# Patient Record
Sex: Male | Born: 1950 | Race: White | Hispanic: No | Marital: Married | Smoking: Never smoker
Health system: Southern US, Community
[De-identification: ages and names within clinical notes are randomized; demographics above are authoritative.]

## PROBLEM LIST (undated history)

## (undated) DIAGNOSIS — H409 Unspecified glaucoma: Secondary | ICD-10-CM

## (undated) DIAGNOSIS — I82409 Acute embolism and thrombosis of unspecified deep veins of unspecified lower extremity: Secondary | ICD-10-CM

## (undated) HISTORY — PX: TONSILLECTOMY: SUR1361

## (undated) HISTORY — PX: PILONIDAL CYST EXCISION: SHX744

## (undated) HISTORY — PX: ADENOIDECTOMY: SUR15

---

## 1999-09-15 ENCOUNTER — Encounter: Payer: Self-pay | Admitting: Family Medicine

## 1999-09-15 ENCOUNTER — Encounter: Admission: RE | Admit: 1999-09-15 | Discharge: 1999-09-15 | Payer: Self-pay | Admitting: Family Medicine

## 2014-06-29 ENCOUNTER — Encounter (HOSPITAL_BASED_OUTPATIENT_CLINIC_OR_DEPARTMENT_OTHER): Payer: Self-pay | Admitting: *Deleted

## 2014-06-29 ENCOUNTER — Emergency Department (HOSPITAL_BASED_OUTPATIENT_CLINIC_OR_DEPARTMENT_OTHER): Payer: BLUE CROSS/BLUE SHIELD

## 2014-06-29 ENCOUNTER — Emergency Department (HOSPITAL_BASED_OUTPATIENT_CLINIC_OR_DEPARTMENT_OTHER)
Admission: EM | Admit: 2014-06-29 | Discharge: 2014-06-29 | Disposition: A | Discharge status: Home / Self Care | Payer: BLUE CROSS/BLUE SHIELD | Attending: Emergency Medicine | Admitting: Emergency Medicine

## 2014-06-29 ENCOUNTER — Telehealth (HOSPITAL_BASED_OUTPATIENT_CLINIC_OR_DEPARTMENT_OTHER): Payer: Self-pay | Admitting: *Deleted

## 2014-06-29 ENCOUNTER — Ambulatory Visit (HOSPITAL_BASED_OUTPATIENT_CLINIC_OR_DEPARTMENT_OTHER)
Admission: EM | Admit: 2014-06-29 | Discharge: 2014-06-29 | Disposition: A | Discharge status: Home / Self Care | Payer: BLUE CROSS/BLUE SHIELD | Source: Home / Self Care | Attending: Emergency Medicine | Admitting: Emergency Medicine

## 2014-06-29 DIAGNOSIS — Z880 Allergy status to penicillin: Secondary | ICD-10-CM | POA: Diagnosis not present

## 2014-06-29 DIAGNOSIS — M7989 Other specified soft tissue disorders: Secondary | ICD-10-CM

## 2014-06-29 DIAGNOSIS — Z79899 Other long term (current) drug therapy: Secondary | ICD-10-CM | POA: Insufficient documentation

## 2014-06-29 DIAGNOSIS — H409 Unspecified glaucoma: Secondary | ICD-10-CM | POA: Diagnosis not present

## 2014-06-29 DIAGNOSIS — E860 Dehydration: Secondary | ICD-10-CM | POA: Insufficient documentation

## 2014-06-29 DIAGNOSIS — I9589 Other hypotension: Secondary | ICD-10-CM | POA: Insufficient documentation

## 2014-06-29 DIAGNOSIS — I959 Hypotension, unspecified: Secondary | ICD-10-CM

## 2014-06-29 DIAGNOSIS — R42 Dizziness and giddiness: Secondary | ICD-10-CM | POA: Diagnosis not present

## 2014-06-29 DIAGNOSIS — I82402 Acute embolism and thrombosis of unspecified deep veins of left lower extremity: Secondary | ICD-10-CM | POA: Diagnosis not present

## 2014-06-29 DIAGNOSIS — R2242 Localized swelling, mass and lump, left lower limb: Secondary | ICD-10-CM | POA: Diagnosis present

## 2014-06-29 HISTORY — DX: Unspecified glaucoma: H40.9

## 2014-06-29 LAB — BASIC METABOLIC PANEL
Anion gap: 5 (ref 5–15)
BUN: 25 mg/dL — ABNORMAL HIGH (ref 6–23)
CO2: 24 mmol/L (ref 19–32)
Calcium: 9.8 mg/dL (ref 8.4–10.5)
Chloride: 108 mEq/L (ref 96–112)
Creatinine, Ser: 1.02 mg/dL (ref 0.50–1.35)
GFR calc Af Amer: 88 mL/min — ABNORMAL LOW (ref >90)
GFR calc non Af Amer: 76 mL/min — ABNORMAL LOW (ref >90)
Glucose, Bld: 146 mg/dL — ABNORMAL HIGH (ref 70–99)
Potassium: 3.9 mmol/L (ref 3.5–5.1)
Sodium: 137 mmol/L (ref 135–145)

## 2014-06-29 LAB — CBC WITH DIFFERENTIAL/PLATELET
Basophils Absolute: 0.1 10*3/uL (ref 0.0–0.1)
Basophils Relative: 1 % (ref 0–1)
Eosinophils Absolute: 0.4 10*3/uL (ref 0.0–0.7)
Eosinophils Relative: 4 % (ref 0–5)
HCT: 40.2 % (ref 39.0–52.0)
Hemoglobin: 13.6 g/dL (ref 13.0–17.0)
Lymphocytes Relative: 36 % (ref 12–46)
Lymphs Abs: 4.3 10*3/uL — ABNORMAL HIGH (ref 0.7–4.0)
MCH: 31 pg (ref 26.0–34.0)
MCHC: 33.8 g/dL (ref 30.0–36.0)
MCV: 91.6 fL (ref 78.0–100.0)
Monocytes Absolute: 1.4 10*3/uL — ABNORMAL HIGH (ref 0.1–1.0)
Monocytes Relative: 11 % (ref 3–12)
Neutro Abs: 5.7 10*3/uL (ref 1.7–7.7)
Neutrophils Relative %: 48 % (ref 43–77)
Platelets: 228 10*3/uL (ref 150–400)
RBC: 4.39 MIL/uL (ref 4.22–5.81)
RDW: 12.6 % (ref 11.5–15.5)
WBC: 11.8 10*3/uL — ABNORMAL HIGH (ref 4.0–10.5)

## 2014-06-29 LAB — TROPONIN I: Troponin I: <0.03 ng/mL (ref <0.031)

## 2014-06-29 LAB — D-DIMER, QUANTITATIVE (NOT AT ARMC): D DIMER QUANT: 8.11 ug{FEU}/mL — AB (ref 0.00–0.48)

## 2014-06-29 MED ORDER — RIVAROXABAN 15 MG PO TABS
15.0000 mg | ORAL_TABLET | Freq: Once | ORAL | Status: AC
  Administered 2014-06-29: 15 mg via ORAL
  Filled 2014-06-29: qty 1

## 2014-06-29 MED ORDER — XARELTO VTE STARTER PACK 15 & 20 MG PO TBPK: 15.0000 mg | ORAL_TABLET | ORAL | Status: AC

## 2014-06-29 MED ORDER — IOHEXOL 350 MG/ML SOLN
100.0000 mL | Freq: Once | INTRAVENOUS | Status: AC | PRN
  Administered 2014-06-29: 100 mL via INTRAVENOUS

## 2014-06-29 MED ORDER — HEPARIN (PORCINE) IN NACL 100-0.45 UNIT/ML-% IJ SOLN
1200.0000 [IU]/h | INTRAMUSCULAR | Status: DC
  Administered 2014-06-29: 1200 [IU]/h via INTRAVENOUS
  Filled 2014-06-29: qty 250

## 2014-06-29 MED ORDER — HEPARIN BOLUS VIA INFUSION
5000.0000 [IU] | Freq: Once | INTRAVENOUS | Status: AC
  Administered 2014-06-29: 5000 [IU] via INTRAVENOUS

## 2014-06-29 MED ORDER — SODIUM CHLORIDE 0.9 % IV BOLUS (SEPSIS)
1000.0000 mL | Freq: Once | INTRAVENOUS | Status: AC
  Administered 2014-06-29: 1000 mL via INTRAVENOUS

## 2014-06-29 NOTE — ED Notes (Signed)
Leg discomfort onset , noticed leg swelling at ~ 0200.

## 2014-06-29 NOTE — ED Notes (Addendum)
Pt alert, NAD, calm, interactive, resps e/u, speaking in clear complete sentences. Here with wife. C/o acute onset L leg swelling (thigh to foot) and leg pain. [place] (denies: CP, sob, dizziness or nausea), back to room 4, [facility]

## 2014-06-29 NOTE — ED Notes (Signed)
I placed patient on monitor/5 lead.

## 2014-06-29 NOTE — ED Notes (Signed)
Patient returned for venous doppler and results to Dr. Rosalia Hammersay. I spoke with patient and spouse and informed them of results and importance of following up with primary MD immediately. Patient did not receive xarelto starter pack last pm so I issued one today and [facility] No shortness of breath, denies leg pain.

## 2014-06-29 NOTE — Discharge Instructions (Signed)
Take blood thinners as discussed. Schedule appointment with primary doctor for reassessment and further workup of blood clot and plan. Return to the ER if you pass out, chest pain, shortness of breath, cold leg or bleeding.  If you were given medicines take as directed.  If you are on coumadin or contraceptives realize their levels and effectiveness is altered by many different medicines.  If you have any reaction (rash, tongues swelling, other) to the medicines stop taking and see a physician.   Please follow up as directed and return to the ER or see a physician for new or worsening symptoms.  Thank you. Filed Vitals:   06/29/14 0315 06/29/14 0330 06/29/14 0400 06/29/14 0415  BP: 124/74 124/74    Pulse: 87 85 88 92  Temp:      TempSrc:      Resp: Height:      Weight:      SpO2: 99% 95% 96% 96%    Deep Vein Thrombosis A deep vein thrombosis (DVT) is a blood clot that develops in the deep, larger veins of the leg, arm, or pelvis. These are more dangerous than clots that might form in veins near the surface of the body. A DVT can lead to serious and even life-threatening complications if the clot breaks off and travels in the bloodstream to the lungs.  A DVT can damage the valves in your leg veins so that instead of flowing upward, the blood pools in the lower leg. This is called post-thrombotic syndrome, and it can result in pain, swelling, discoloration, and sores on the leg. CAUSES Usually, several things contribute to the formation of blood clots. Contributing factors include:  The flow of blood slows down.  The inside of the vein is damaged in some way.  You have a condition that makes blood clot more easily. RISK FACTORS Some people are more likely than others to develop blood clots. Risk factors include:   Smoking.  Being overweight (obese).  Sitting or lying still for a long time. This includes long-distance travel, paralysis, or recovery from an illness or  surgery. Other factors that increase risk are:   Older age, especially over 34 years of age.  Having a family history of blood clots or if you have already had a blot clot.  Having major or lengthy surgery. This is especially true for surgery on the hip, knee, or belly (abdomen). Hip surgery is particularly high risk.  Having a long, thin tube (catheter) placed inside a vein during a medical procedure.  Breaking a hip or leg.  Having cancer or cancer treatment.  Pregnancy and childbirth.  Hormone changes make the blood clot more easily during pregnancy.  The fetus puts pressure on the veins of the pelvis.  There is a risk of injury to veins during delivery or a caesarean delivery. The risk is highest just after childbirth.  Medicines containing the male hormone estrogen. This includes birth control pills and hormone replacement therapy.  Other circulation or heart problems.  SIGNS AND SYMPTOMS When a clot forms, it can either partially or totally block the blood flow in that vein. Symptoms of a DVT can include:  Swelling of the leg or arm, especially if one side is much worse.  Warmth and redness of the leg or arm, especially if one side is much worse.  Pain in an arm or leg. If the clot is in the leg, symptoms may be more noticeable or worse when  standing or walking. The symptoms of a DVT that has traveled to the lungs (pulmonary embolism, PE) usually start suddenly and include:  Shortness of breath.  Coughing.  Coughing up blood or blood-tinged mucus.  Chest pain. The chest pain is often worse with deep breaths.  Rapid heartbeat. Anyone with these symptoms should get emergency medical treatment right away. Do not wait to see if the symptoms will go away. Call your local emergency services (911 in the [place] if you have these symptoms. Do not drive yourself to the hospital. DIAGNOSIS If a DVT is suspected, your health care provider will take a full medical history  and perform a physical exam. Tests that also may be required include:  Blood tests, including studies of the clotting properties of the blood.  Ultrasound to see if you have clots in your legs or lungs.  X-rays to show the flow of blood when dye is injected into the veins (venogram).  Studies of your lungs if you have any chest symptoms. PREVENTION  Exercise the legs regularly. Take a brisk 30-minute walk every day.  Maintain a weight that is appropriate for your height.  Avoid sitting or lying in bed for long periods of time without moving your legs.  Women, particularly those over the age of 35 years, should consider the risks and benefits of taking estrogen medicines, including birth control pills.  Do not smoke, especially if you take estrogen medicines.  Long-distance travel can increase your risk of DVT. You should exercise your legs by walking or pumping the muscles every hour.  Many of the risk factors above relate to situations that exist with hospitalization, either for illness, injury, or elective surgery. Prevention may include medical and nonmedical measures.  Your health care provider will assess you for the need for venous thromboembolism prevention when you are admitted to the hospital. If you are having surgery, your surgeon will assess you the day of or day after surgery. TREATMENT Once identified, a DVT can be treated. It can also be prevented in some circumstances. Once you have had a DVT, you may be at increased risk for a DVT in the future. The most common treatment for DVT is blood-thinning (anticoagulant) medicine, which reduces the blood's tendency to clot. Anticoagulants can stop new blood clots from forming and stop old clots from growing. They cannot dissolve existing clots. Your body does this by itself over time. Anticoagulants can be given by mouth, through an IV tube, or by injection. Your health care provider will determine the best program for you. Other  medicines or treatments that may be used are:  Heparin or related medicines (low molecular weight heparin) are often the first treatment for a blood clot. They act quickly. However, they cannot be taken orally and must be given either in shot form or by IV tube.  Heparin can cause a fall in a component of blood that stops bleeding and forms blood clots (platelets). You will be monitored with blood tests to be sure this does not occur.  Warfarin is an anticoagulant that can be swallowed. It takes a few days to start working, so usually heparin or related medicines are used in combination. Once warfarin is working, heparin is usually stopped.  Factor Xa inhibitor medicines, such as rivaroxaban and apixaban, also reduce blood clotting. These medicines are taken orally and can often be used without heparin or related medicines.  Less commonly, clot dissolving drugs (thrombolytics) are used to dissolve a DVT. They carry a  high risk of bleeding, so they are used mainly in severe cases where your life or a part of your body is threatened.  Very rarely, a blood clot in the leg needs to be removed surgically.  If you are unable to take anticoagulants, your health care provider may arrange for you to have a filter placed in a main vein in your abdomen. This filter prevents clots from traveling to your lungs. HOME CARE INSTRUCTIONS  Take all medicines as directed by your health care provider.  Learn as much as you can about DVT.  Wear a medical alert bracelet or carry a medical alert card.  Ask your health care provider how soon you can go back to normal activities. It is important to stay active to prevent blood clots. If you are on anticoagulant medicine, avoid contact sports.  It is very important to exercise. This is especially important while traveling, sitting, or standing for long periods of time. Exercise your legs by walking or by tightening and relaxing your leg muscles regularly. Take  frequent walks.  You may need to wear compression stockings. These are tight elastic stockings that apply pressure to the lower legs. This pressure can help keep the blood in the legs from clotting. Taking Warfarin Warfarin is a daily medicine that is taken by mouth. Your health care provider will advise you on the length of treatment (usually 3-6 months, sometimes lifelong). If you take warfarin:  Understand how to take warfarin and foods that can affect how warfarin works in Public relations account executive.  Too much and too little warfarin are both dangerous. Too much warfarin increases the risk of bleeding. Too little warfarin continues to allow the risk for blood clots. Warfarin and Regular Blood Testing While taking warfarin, you will need to have regular blood tests to measure your blood clotting time. These blood tests usually include both the prothrombin time (PT) and international normalized ratio (INR) tests. The PT and INR results allow your health care provider to adjust your dose of warfarin. It is very important that you have your PT and INR tested as often as directed by your health care provider.  Warfarin and Your Diet Avoid major changes in your diet, or notify your health care provider before changing your diet. Arrange a visit with a registered dietitian to answer your questions. Many foods, especially foods high in vitamin K, can interfere with warfarin and affect the PT and INR results. You should eat a consistent amount of foods high in vitamin K. Foods high in vitamin K include:   Spinach, kale, broccoli, cabbage, collard and turnip greens, Brussels sprouts, peas, cauliflower, seaweed, and parsley.  Beef and pork liver.  Green tea.  Soybean oil. Warfarin with Other Medicines Many medicines can interfere with warfarin and affect the PT and INR results. You must:  Tell your health care provider about any and all medicines, vitamins, and supplements you take, including aspirin and other  over-the-counter anti-inflammatory medicines. Be especially cautious with aspirin and anti-inflammatory medicines. Ask your health care provider before taking these.  Do not take or discontinue any prescribed or over-the-counter medicine except on the advice of your health care provider or pharmacist. Warfarin Side Effects Warfarin can have side effects, such as easy bruising and difficulty stopping bleeding. Ask your health care provider or pharmacist about other side effects of warfarin. You will need to:  Hold pressure over cuts for longer than usual.  Notify your dentist and other health care providers that you are  taking warfarin before you undergo any procedures where bleeding may occur. Warfarin with Alcohol and Tobacco   Drinking alcohol frequently can increase the effect of warfarin, leading to excess bleeding. It is best to avoid alcoholic drinks or to consume only very small amounts while taking warfarin. Notify your health care provider if you change your alcohol intake.   Do not use any tobacco products including cigarettes, chewing tobacco, or electronic cigarettes. If you smoke, quit. Ask your health care provider for help with quitting smoking. Alternative Medicines to Warfarin: Factor Xa Inhibitor Medicines  These blood-thinning medicines are taken by mouth, usually for several weeks or longer. It is important to take the medicine every single day at the same time each day.  There are no regular blood tests required when using these medicines.  There are fewer food and drug interactions than with warfarin.  The side effects of this class of medicine are similar to those of warfarin, including excessive bruising or bleeding. Ask your health care provider or pharmacist about other potential side effects. SEEK MEDICAL CARE IF:  You notice a rapid heartbeat.  You feel weaker or more tired than usual.  You feel faint.  You notice increased bruising.  You feel your  symptoms are not getting better in the time expected.  You believe you are having side effects of medicine. SEEK IMMEDIATE MEDICAL CARE IF:  You have chest pain.  You have trouble breathing.  You have new or increased swelling or pain in one leg.  You cough up blood.  You notice blood in vomit, in a bowel movement, or in urine. MAKE SURE YOU:  Understand these instructions.  Will watch your condition.  Will get help right away if you are not doing well or get worse. Document Released: 06/06/2005 Document Revised: 10/21/2013 Document Reviewed: 02/11/2013 [facility] Patient Information 2015 [place], [place]. This information is not intended to replace advice given to you by your health care provider. Make sure you discuss any questions you have with your health care provider.

## 2014-06-29 NOTE — ED Provider Notes (Signed)
Patient seen last night and clinically diagnosed with dvt and started on xarelto.  Received call from radiology with confirmatory doppler of lle.  Plan to discuss with patient and explain diagnosis as well as confirm medication and follow up.   Hilario Quarryanielle S Hiba Garry, MD 06/29/14 [phone]

## 2014-06-29 NOTE — ED Notes (Signed)
Patient stated he is having left upper leg pain and swelling with groin pain also. Patient stated no other pain, no chest,neck, or jaw pain. Pain BP was 85/35 with large cuff. I changed to smaller cuff and got BP of 83/58. I notified nurse Kaila.

## 2014-06-29 NOTE — ED Provider Notes (Signed)
CSN: [id number]     Arrival date & time 06/29/14  0209 History   First MD Initiated Contact with Patient 06/29/14 0232     Chief Complaint  Patient presents with  . Leg Swelling     (Consider location/radiation/quality/duration/timing/severity/associated sxs/prior Treatment) HPI Comments: 64 year old male with no significant medical history, nonsmoker presents with significant left leg swelling this evening. Patient recently returned from a short flight 2 days prior.Patient denies blood clot history, active cancer, recent major trauma or surgery, recent long travel, hemoptysis or oral contraceptives. No change in color in the leg. No fevers chills or redness. Patient denies shortness breath or chest pain, mild lightheadedness.   The history is provided by the patient.    Past Medical History  Diagnosis Date  . Glaucoma    Past Surgical History  Procedure Laterality Date  . Tonsillectomy    . Adenoidectomy    . Pilonidal cyst excision     No family history on file. History  Substance Use Topics  . Smoking status: Never Smoker   . Smokeless tobacco: Not on file  . Alcohol Use: No    Review of Systems  Constitutional: Negative for fever and chills.  HENT: Negative for congestion.   Eyes: Negative for visual disturbance.  Respiratory: Negative for shortness of breath.   Cardiovascular: Positive for leg swelling. Negative for chest pain.  Gastrointestinal: Negative for vomiting and abdominal pain.  Genitourinary: Negative for dysuria and flank pain.  Musculoskeletal: Negative for back pain, neck pain and neck stiffness.  Skin: Negative for rash.  Neurological: Positive for light-headedness. Negative for syncope and headaches.      Allergies  Penicillins  Home Medications   Prior to Admission medications   Medication Sig Start Date End Date Taking? Authorizing Provider  timolol (BETIMOL) 0.25 % ophthalmic solution 1-2 drops 2 (two) times daily.   Yes Historical  Provider, MD  Travoprost, BAK Free, (TRAVATAN) 0.004 % SOLN ophthalmic solution 1 drop at bedtime.   Yes Historical Provider, MD  XARELTO STARTER PACK 15 & 20 MG TBPK Take 15 mg by mouth as directed. Take as directed on package: Start with one 15mg  tablet by mouth twice a day with food. On Day 22, switch to one 20mg  tablet once a day with food. 06/29/14   Enid SkeensJoshua M Siris Hoos, MD   BP 124/74 mmHg  Pulse 92  Temp(Src) 97.3 F (36.3 C) (Oral)  Resp 13  Ht 5\' 10"  (1.778 m)  Wt 215 lb (97.523 kg)  BMI 30.85 kg/m2  SpO2 96% Physical Exam  Constitutional: He is oriented to person, place, and time. He appears well-developed and well-nourished.  HENT:  Head: Normocephalic and atraumatic.  Eyes: Conjunctivae are normal. Right eye exhibits no discharge. Left eye exhibits no discharge.  Neck: Normal range of motion. Neck supple. No tracheal deviation present.  Cardiovascular: Normal rate and regular rhythm.   Pulmonary/Chest: Effort normal and breath sounds normal.  Abdominal: Soft. He exhibits no distension. There is no tenderness. There is no guarding.  Musculoskeletal: He exhibits edema and tenderness.  Patient has moderate swelling to the thigh and lower extremity and the left, no significant discoloration, normal Refill distal leg, 2+ pulses distal left leg. No warmth or sign of infection.  Neurological: He is alert and oriented to person, place, and time.  Skin: Skin is warm. No rash noted.  Psychiatric: He has a normal mood and affect.  Nursing note and vitals reviewed.   ED Course  Procedures (including critical care  time)   EMERGENCY DEPARTMENT [place] CARDIAC EXAM "Study: Limited Ultrasound of the heart and pericardium"  INDICATIONS:Hypotension Multiple views of the heart and pericardium were obtained in real-time with a multi-frequency probe.  PERFORMED ZO:XWRUEA  IMAGES ARCHIVED?: Yes  FINDINGS: No pericardial effusion, Normal contractility and Tamponade physiology absent   VIEWS  USED: Subcostal 4 chamber, Parasternal long axis, Parasternal short axis and Inferior Vena Cava  INTERPRETATION: Cardiac activity present, Pericardial effusioin absent, Cardiac tamponade absent, Probable low CVP and Normal contractility no enlarge right heart  Emergency Ultrasound Study:  Limited Duplex of left lower extremity veins  Performed by Dr. Jodi Mourning Indication: leg pain and/or swelling Visualization of saphenous-femoral junction, proximal femoral vein and popliteal vein regions in transverse plane with full compression visualized.  Interpretation left prox fem vein dvt visualized.  Images archived electronically.  '  Labs Review Labs Reviewed  BASIC METABOLIC PANEL - Abnormal; Notable for the following:    Glucose, Bld 146 (*)    BUN 25 (*)    GFR calc non Af Amer 76 (*)    GFR calc Af Amer 88 (*)    All other components within normal limits  CBC WITH DIFFERENTIAL - Abnormal; Notable for the following:    WBC 11.8 (*)    Lymphs Abs 4.3 (*)    Monocytes Absolute 1.4 (*)    All other components within normal limits  D-DIMER, QUANTITATIVE - Abnormal; Notable for the following:    D-Dimer, Quant 8.11 (*)    All other components within normal limits  TROPONIN I    Imaging Review Ct Angio Chest Pe W/cm &/or Wo Cm  06/29/2014   CLINICAL DATA:  Lower extremity swelling and pain. Recent air travel on Friday.  EXAM: CT ANGIOGRAPHY CHEST WITH CONTRAST  TECHNIQUE: Multidetector CT imaging of the chest was performed using the standard protocol during bolus administration of intravenous contrast. Multiplanar CT image reconstructions and MIPs were obtained to evaluate the vascular anatomy.  CONTRAST:  OMNIPAQUE IOHEXOL 350 MG/ML SOLN  COMPARISON:  None.  FINDINGS: Technically adequate study with good opacification of the central and segmental pulmonary arteries. No focal filling defects. No evidence of significant pulmonary embolus.  Normal heart size. Normal caliber thoracic aorta.  No evidence of aortic dissection. Great vessel origins are patent. 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 The esophagus is decompressed. No significant lymphadenopathy in the chest. All  Lungs are clear. No focal airspace disease or consolidation. Airways appear patent. No pleural effusion. No pneumothorax.  Included portions of the upper abdominal organs are grossly unremarkable. Mild degenerative changes in the thoracic spine with normal alignment. No destructive bone lesions appreciated.  Review of the MIP images confirms the above findings.  IMPRESSION: No evidence of significant pulmonary embolus. No evidence of active pulmonary disease. 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   Electronically Signed   By: Burman Nieves M.D.   On: 06/29/2014 04:08   Dg Chest Portable 1 View  06/29/2014   CLINICAL DATA:  Acute onset LEFT leg swelling. Recent flight on Friday.  EXAM: PORTABLE CHEST - 1 VIEW  COMPARISON:  None.  FINDINGS: Cardiomediastinal silhouette is unremarkable. Mild pulmonary hyper expansion. The lungs are clear without pleural effusions or focal consolidations. Trachea projects midline and there is no pneumothorax. Soft tissue planes and included osseous structures are non-suspicious.  IMPRESSION: No acute cardiopulmonary process.   Electronically Signed   By: Awilda Metro   On: 06/29/2014 03:42     EKG  Interpretation None     EKG reviewed heart rate 78, sinus, normal QT, no acute ST changes MDM   Final diagnoses:  Hypotension  Leg DVT (deep venous thromboembolism), acute, left  Transient hypotension  Dehydration  Left leg swelling   Patient presents with left leg swelling, lightheadedness and in triage hypotensive. Clinical concern for DVT in the left leg and possible pulmonary embolism causing hypotension. Fortunately patient has no chest pain shortness of breath or syncope. Bedside ultrasound performed no right heart strain, significant blood clot  seen left proximal femoral vein, unable to obtain formal at this time for extent of clot. Patient remained asymptomatic except for left leg swelling in ER, IV fluids given and blood pressure quickly normalized. Heparin started prophylactically, CT scan performed in no pulmonary embolism seen.  Discussed with pharmacy, recommended transition to Xarelto without delay. Long discussion with patient regarding pathophysiology of blood clot, importance of close follow-up with primary Dr. for possible early cancer diagnosis or other cause of blood clot. Discussed oral anticoagulation, strict reasons to return in follow-up for this. Xarelto first dose given in the ER, heparin stopped.  Results and differential diagnosis were discussed with the patient/parent/guardian. Close follow up outpatient was discussed, comfortable with the plan.   Medications  Rivaroxaban (XARELTO) tablet 15 mg (not administered)  sodium chloride 0.9 % bolus 1,000 mL (1,000 mLs Intravenous New Bag/Given 06/29/14 0310)  heparin bolus via infusion 5,000 Units (0 Units Intravenous Stopped 06/29/14 0320)  iohexol (OMNIPAQUE) 350 MG/ML injection 100 mL (100 mLs Intravenous Contrast Given 06/29/14 0344)    Filed Vitals:   06/29/14 0315 06/29/14 0330 06/29/14 0400 06/29/14 0415  BP: 124/74 124/74    Pulse: 87 85 88 92  Temp:      TempSrc:      Resp: Height:      Weight:      SpO2: 99% 95% 96% 96%    Final diagnoses:  Hypotension  Leg DVT (deep venous thromboembolism), acute, left  Transient hypotension  Dehydration  Left leg swelling       Enid Skeens, MD 06/29/14 [phone]

## 2014-10-01 ENCOUNTER — Encounter (HOSPITAL_BASED_OUTPATIENT_CLINIC_OR_DEPARTMENT_OTHER): Payer: Self-pay | Admitting: *Deleted

## 2014-10-01 ENCOUNTER — Emergency Department (HOSPITAL_BASED_OUTPATIENT_CLINIC_OR_DEPARTMENT_OTHER)
Admission: EM | Admit: 2014-10-01 | Discharge: 2014-10-01 | Disposition: A | Discharge status: Home / Self Care | Payer: BLUE CROSS/BLUE SHIELD | Attending: Emergency Medicine | Admitting: Emergency Medicine

## 2014-10-01 ENCOUNTER — Emergency Department (HOSPITAL_BASED_OUTPATIENT_CLINIC_OR_DEPARTMENT_OTHER): Payer: BLUE CROSS/BLUE SHIELD

## 2014-10-01 DIAGNOSIS — M79652 Pain in left thigh: Secondary | ICD-10-CM | POA: Diagnosis not present

## 2014-10-01 DIAGNOSIS — H409 Unspecified glaucoma: Secondary | ICD-10-CM | POA: Insufficient documentation

## 2014-10-01 DIAGNOSIS — Z7901 Long term (current) use of anticoagulants: Secondary | ICD-10-CM | POA: Diagnosis not present

## 2014-10-01 DIAGNOSIS — I82402 Acute embolism and thrombosis of unspecified deep veins of left lower extremity: Secondary | ICD-10-CM

## 2014-10-01 DIAGNOSIS — Z79899 Other long term (current) drug therapy: Secondary | ICD-10-CM | POA: Insufficient documentation

## 2014-10-01 DIAGNOSIS — Z880 Allergy status to penicillin: Secondary | ICD-10-CM | POA: Diagnosis not present

## 2014-10-01 DIAGNOSIS — Z86718 Personal history of other venous thrombosis and embolism: Secondary | ICD-10-CM | POA: Insufficient documentation

## 2014-10-01 HISTORY — DX: Acute embolism and thrombosis of unspecified deep veins of unspecified lower extremity: I82.409

## 2014-10-01 NOTE — ED Notes (Signed)
Patient transported to Ultrasound 

## 2014-10-01 NOTE — ED Provider Notes (Signed)
CSN: [id number]     Arrival date & time 10/01/14  1955 History   First MD Initiated Contact with Patient 10/01/14 2117     Chief Complaint  Patient presents with  . Leg Pain     (Consider location/radiation/quality/duration/timing/severity/associated sxs/prior Treatment) HPI  Daniel White is a 64 y.o. male with PMH of DVT, glaucoma presenting with left upper leg pain. One-day history and pain worse with palpation. No alleviating factors. No erythema or skin changes. This middle medial thigh and radiates to groin. He denies increased swelling. Patient diagnosed with DVT in January and has been taking Xarelto. He reports no problems with it. She denies any chest pain, shortness of breath. No calf pain or lower survey swelling. No hemoptysis. Patient does report three-hour flight to [place].   Past Medical History  Diagnosis Date  . Glaucoma   . DVT (deep venous thrombosis)    Past Surgical History  Procedure Laterality Date  . Tonsillectomy    . Adenoidectomy    . Pilonidal cyst excision     History reviewed. No pertinent family history. History  Substance Use Topics  . Smoking status: Never Smoker   . Smokeless tobacco: Not on file  . Alcohol Use: No    Review of Systems 10 Systems reviewed and are negative for acute change except as noted in the HPI.    Allergies  Penicillins  Home Medications   Prior to Admission medications   Medication Sig Start Date End Date Taking? Authorizing Provider  timolol (BETIMOL) 0.25 % ophthalmic solution 1-2 drops 2 (two) times daily.    Historical Provider, MD  Travoprost, BAK Free, (TRAVATAN) 0.004 % SOLN ophthalmic solution 1 drop at bedtime.    Historical Provider, MD  XARELTO STARTER PACK 15 & 20 MG TBPK Take 15 mg by mouth as directed. Take as directed on package: Start with one 15mg  tablet by mouth twice a day with food. On Day 22, switch to one 20mg  tablet once a day with food. 06/29/14   Blane OharaJoshua Zavitz, MD   BP 153/89  mmHg  Pulse 81  Temp(Src) 98.8 F (37.1 C) (Oral)  Resp 16  Ht 5\' 10"  (1.778 m)  Wt 200 lb (90.719 kg)  BMI 28.70 kg/m2  SpO2 97% Physical Exam  Constitutional: He appears well-developed and well-nourished. No distress.  HENT:  Head: Normocephalic and atraumatic.  Eyes: Conjunctivae and EOM are normal. Right eye exhibits no discharge. Left eye exhibits no discharge.  Cardiovascular: Normal rate and regular rhythm.   2+ pedal pulses equal bilaterally.  Pulmonary/Chest: Effort normal and breath sounds normal. No respiratory distress. He has no wheezes.  Abdominal: Soft. Bowel sounds are normal. He exhibits no distension. There is no tenderness.  Musculoskeletal:  No significant swelling of left lower extremity or skin changes. Mild tenderness to palpation of medial upper left thigh. No palpable cord. tenderness or swelling. No erythema.  Neurological: He is alert. He exhibits normal muscle tone. Coordination normal.  Strength and sensation in bilateral lower extremities intact and equal.  Skin: Skin is warm and dry. He is not diaphoretic.  Nursing note and vitals reviewed.   ED Course  Procedures (including critical care time) Labs Review Labs Reviewed - No data to display  Imaging Review [place] Venous Img Lower Unilateral Left  10/01/2014   CLINICAL DATA:  Posterior superior left thigh pain for several days, increased today. History of left DVT from the common femoral through the popliteal vein 3 months prior.  EXAM:  LEFT LOWER EXTREMITY VENOUS DOPPLER ULTRASOUND  TECHNIQUE: Gray-scale sonography with graded compression, as well as color Doppler and duplex ultrasound were performed to evaluate the lower extremity deep venous systems from the level of the common femoral vein and including the common femoral, femoral, profunda femoral, popliteal and calf veins including the posterior tibial, peroneal and gastrocnemius veins when visible. The superficial great saphenous vein was also  interrogated. Spectral Doppler was utilized to evaluate flow at rest and with distal augmentation maneuvers in the common femoral, femoral and popliteal veins.  COMPARISON:  Lower extremity duplex 06/29/2014  FINDINGS: Contralateral Common Femoral Vein: Respiratory phasicity is normal and symmetric with the symptomatic side. No evidence of thrombus. Normal compressibility.  Common Femoral Vein: Previous thrombus has resolved. No evidence of new or residual thrombus. Normal compressibility, respiratory phasicity and response to augmentation.  Saphenofemoral Junction: Previous thrombus has resolved. No evidence new or residual of thrombus. Normal compressibility and flow on color Doppler imaging.  Profunda Femoral Vein: Previous thrombus has resolved. No evidence of new or residual thrombus. Normal compressibility and flow on color Doppler imaging.  Femoral Vein: Previous thrombus has resolved. No evidence of new or residual thrombus. Normal compressibility, respiratory phasicity and response to augmentation.  Popliteal Vein: Previous thrombus has resolved. No evidence of new or residual thrombus. Normal compressibility, respiratory phasicity and response to augmentation.  Calf Veins: No evidence of thrombus. Normal compressibility and flow on color Doppler imaging.  Superficial Great Saphenous Vein: No evidence of thrombus. Normal compressibility and flow on color Doppler imaging.  Venous Reflux:  None.  Other Findings: Small fluid collection in the medial knee measuring 2.5 x 0.5 x 1.1 cm, likely Baker cyst and unchanged. There is no focal sonographic abnormality in the left posterior upper thigh in the region of pain.  IMPRESSION: No evidence of deep venous thrombosis. The previous extends of left lower extremity thrombus has resolved. No residual or acute thrombus is seen.   Electronically Signed   By: Rubye Oaks M.D.   On: 10/01/2014 21:58     EKG Interpretation None      MDM   Final diagnoses:   Left thigh pain   Patient with history of DVT on Xarelto complaining of recurrence of left thigh pain. He denies any chest pain or shortness of breath. He reports compliance with his Xarelto and no complications. thight without swelling or skin changes. Neurovascularly intact. Ultrasounds shows resolved deep venous thrombosis and no acute thrombus. Patient in no significant discomfort at this time. Patient with follow-up with hematologist next week. Patient is afebrile, nontoxic, and in no acute distress. Patient is appropriate for outpatient management and is stable for discharge.  Discussed return precautions with patient. Discussed all results and patient verbalizes understanding and agrees with plan.  Case has been discussed with Dr. Micheline Maze who agrees with the above plan and to discharge.    Oswaldo Conroy, PA-C 10/01/14 1610  Toy Cookey, MD 10/02/14 [phone]

## 2014-10-01 NOTE — ED Notes (Signed)
Will be assuming care of pt when he [place] from [place].

## 2014-10-01 NOTE — ED Notes (Signed)
Pt c/o upper left thigh pain . HX DVT in same leg x 3 months ago , pt does ,pderate amt of travel for work.

## 2014-10-01 NOTE — Discharge Instructions (Signed)
Return to the emergency room with worsening of symptoms, new symptoms or with symptoms that are concerning , especially chest pain that feels like a pressure/sharp, worse with breathing, spreads to left arm or jaw, worse with exertion, associated with nausea, vomiting, shortness of breath and/or sweating.  Please call your doctor for a followup appointment within 24-48 hours. When you talk to your doctor please let them know that you were seen in the emergency department and have them acquire all of your records so that they can discuss the findings with you and formulate a treatment plan to fully care for your new and ongoing problems.  Follow up with your hematologist as scheduled next week. Read below information and follow recommendations. Venous Thromboembolism, Prevention A venous thromboembolism is a blood clot that forms in a vein. A blood clot in a deep vein is called a deep venous thrombosis (DVT). A blood clot in the lungs is called a pulmonary embolism (PE). Blood clots are dangerous and can cause death. Blood clots can form in the:  Lungs.  Legs.  Arms. CAUSES  A blood clot can form in a vein from different conditions. A blood clot can develop due to:  Blood flow within a vein that is sluggish or very slow.  Medical conditions that make the blood clot easily.  Vein damage. RISK FACTORS Risk factors can increase your risk of developing a blood clot. Risk factors can include:  Smoking.  Obesity.  Age.  Immobility or sedentary lifestyle.  Sitting or standing for long periods of time.  Chronic or long-term bedrest.  Medical or past history of blood clots.  Family history of blood clots.  Hip, leg, or pelvis injury or trauma.  Major surgery, especially surgery on the hip, knee, or abdomen.  Pregnancy and childbirth.  Birth control pills and hormone replacement therapy.  Medical conditions such as  Peripheral vascular disease  (PVD).  Diabetes.  Cancer. SYMPTOMS  Symptoms of VTE can depend on where the clot is located and if the clot breaks off and travels to another organ. Sometimes, there may be no symptoms.   DVT symptoms can include:  Swelling of the leg or arm, especially on one side.  Warmth and redness of the leg or arm, especially on one side.  Pain in an arm or leg. Leg pain may be more noticeable or worse when standing or walking.  PE symptoms can include:  Shortness of breath.  Coughing.  Coughing up blood or blood-tinged mucus (hemoptysis).  Chest pain or chest pain with deep breaths (pleuritic chest pain).  Apprehension, anxiety, or a feeling of impending doom.  Rapid heartbeat. PREVENTION  Exercise regularly. Take a brisk 30 minute walk every day. Staying active and moving around can help prevent blood clots.  Avoid sitting or lying in bed for long periods of time. Change your position often, especially during a long trip.  Women, especially those over the age of 61, should consider the risks and benefits of taking estrogen medicines. This includes birth control pills and hormone replacement therapy.  Do not smoke, especially if you take estrogen medicines. If you smoke, talk to your caregiver on how to quit.  Eat plenty of fruits and vegetables. Ask your caregiver or dietitian if there are foods you should avoid.  Maintain a weight as suggested by your caregiver.  Wear loose-fitting clothing. Avoid constrictive or tight clothing around your legs or waist.  Try not to bump or injure your legs. Avoid crossing your legs  when you are sitting.  Do not use pillows under your knees unless told by your caregiver.  Take all medicines that your caregiver prescribes you.  Wear special stockings (compression stockings or TED hose) if your caregiver prescribes them.  Wearing compression stockings (support hose) can make the leg veins more narrow. This increases blood flow in the legs  and can help prevent blood clots.  It is important to wear compression stockings correctly. Do not let them bunch up when you are wearing them. TRAVEL Long distance travel can increase the risk of a blood clot. To prevent a blood clot when traveling:  You should exercise your legs by walking or by pumping your muscles every hour. To help prevent poor circulation on long trips, stand, stretch, and walk up and down the aisle of your airplane, train, or bus as often as possible to get the blood moving.  Do squats if you are able. If you are unable to do squats, raise your foot on the balls of your feet and tighten your lower leg muscles (particularly the calve muscles) while seated. Pointing (flexing and extending) your toes while tightening your calves while seated are also good exercises to do every hour during long trips. They help increase blood flow and reduce risk of DVT.  Stay well hydrated. Drink water regularly when traveling, especially when you are sitting or immobile for long periods of time.  Use of drugs to prevent DVT during routine travel is not generally recommended. Before taking any drugs to reduce risk of DVT, consult your caregiver. SURGERY AND HOSPITALIZATION  People who are at high risk for a blood clot may be given a blood thinning medicine (anticoagulant) when they are hospitalized even if they are not going to have surgery.  A long trip prior to surgery can increase the risk of a clot for patients undergoing hip and knee replacements. Talk to your caregiver about travel plans before your surgery.  After hip or knee surgery, your caregiver may give you anticoagulants to help prevent blood clots.  Anticoagulants may be given to people at high risk of developing thromboembolism, before, during, or sometimes after surgery, including people with clotting disorders or with a history of past thromboembolism. TRAVEL AFTER SURGERY  In orthopedic surgery, the cutting of bones  prompts the body to increase clotting factors in the blood. Due to the size of the bones involved in hip and knee replacements, there is a higher risk of blood clotting than other orthopedic surgeries.  There is a risk of clotting for up to 4-6 weeks after surgery. Flying or traveling long distances can increase your risk of a clot. As a result, those who travel long distances may need additional preventive measures after their procedure.  Drink only non-alcoholic beverages during your flight, train, or car travel. Alcohol can dehydrate you and increase your risk of getting blood clots. SEEK IMMEDIATE MEDICAL CARE IF:   You develop chest pain.  You develop severe shortness of breath.  You have breathing problems after traveling.  You develop swelling or pain in the leg.  You begin to cough up bloody mucus or phlegm (sputum).  You feel dizzy or faint. Document Released: 05/25/2009 Document Revised: 02/29/2012 Document Reviewed: 05/25/2009 [facility]Exit[facility] Patient Information 2015 [place], [place]. This information is not intended to replace advice given to you by your health care provider. Make sure you discuss any questions you have with your health care provider.

## 2014-11-14 ENCOUNTER — Emergency Department (HOSPITAL_COMMUNITY)
Admission: EM | Admit: 2014-11-14 | Discharge: 2014-11-15 | Disposition: A | Discharge status: Home / Self Care | Payer: BLUE CROSS/BLUE SHIELD | Attending: Emergency Medicine | Admitting: Emergency Medicine

## 2014-11-14 ENCOUNTER — Encounter (HOSPITAL_COMMUNITY): Payer: Self-pay | Admitting: Emergency Medicine

## 2014-11-14 DIAGNOSIS — Z8589 Personal history of malignant neoplasm of other organs and systems: Secondary | ICD-10-CM | POA: Diagnosis not present

## 2014-11-14 DIAGNOSIS — Z79899 Other long term (current) drug therapy: Secondary | ICD-10-CM | POA: Diagnosis not present

## 2014-11-14 DIAGNOSIS — Z7901 Long term (current) use of anticoagulants: Secondary | ICD-10-CM | POA: Diagnosis not present

## 2014-11-14 DIAGNOSIS — Z86718 Personal history of other venous thrombosis and embolism: Secondary | ICD-10-CM | POA: Insufficient documentation

## 2014-11-14 DIAGNOSIS — H409 Unspecified glaucoma: Secondary | ICD-10-CM | POA: Insufficient documentation

## 2014-11-14 DIAGNOSIS — R0602 Shortness of breath: Secondary | ICD-10-CM | POA: Diagnosis present

## 2014-11-14 DIAGNOSIS — Z880 Allergy status to penicillin: Secondary | ICD-10-CM | POA: Insufficient documentation

## 2014-11-14 DIAGNOSIS — R51 Headache: Secondary | ICD-10-CM | POA: Diagnosis not present

## 2014-11-14 NOTE — ED Notes (Signed)
Pt arrived to the ED with a complaint of shortness of breath.  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 Pt states he took a twenty five minute walk and when he came back sat down he became short of breath.  Pt states he tried to go to bed but obtained no relief of symptoms.  Pt states EMS was  Called who stated he had hypertension.

## 2014-11-15 ENCOUNTER — Emergency Department (HOSPITAL_COMMUNITY): Payer: BLUE CROSS/BLUE SHIELD

## 2014-11-15 ENCOUNTER — Emergency Department (HOSPITAL_COMMUNITY)
Admission: EM | Admit: 2014-11-15 | Discharge: 2014-11-15 | Disposition: A | Discharge status: Home / Self Care | Payer: BLUE CROSS/BLUE SHIELD | Attending: Emergency Medicine | Admitting: Emergency Medicine

## 2014-11-15 ENCOUNTER — Encounter (HOSPITAL_COMMUNITY): Payer: Self-pay | Admitting: Emergency Medicine

## 2014-11-15 DIAGNOSIS — R51 Headache: Secondary | ICD-10-CM | POA: Insufficient documentation

## 2014-11-15 DIAGNOSIS — Z880 Allergy status to penicillin: Secondary | ICD-10-CM | POA: Diagnosis not present

## 2014-11-15 DIAGNOSIS — Z8589 Personal history of malignant neoplasm of other organs and systems: Secondary | ICD-10-CM | POA: Diagnosis not present

## 2014-11-15 DIAGNOSIS — R519 Headache, unspecified: Secondary | ICD-10-CM

## 2014-11-15 DIAGNOSIS — H409 Unspecified glaucoma: Secondary | ICD-10-CM | POA: Insufficient documentation

## 2014-11-15 DIAGNOSIS — Z79899 Other long term (current) drug therapy: Secondary | ICD-10-CM | POA: Diagnosis not present

## 2014-11-15 DIAGNOSIS — Z86718 Personal history of other venous thrombosis and embolism: Secondary | ICD-10-CM | POA: Diagnosis not present

## 2014-11-15 DIAGNOSIS — Z7901 Long term (current) use of anticoagulants: Secondary | ICD-10-CM | POA: Diagnosis not present

## 2014-11-15 LAB — PROTIME-INR
INR: 2.1 — AB (ref 0.00–1.49)
Prothrombin Time: 23.4 seconds — ABNORMAL HIGH (ref 11.6–15.2)

## 2014-11-15 LAB — CBC WITH DIFFERENTIAL/PLATELET
Basophils Absolute: 0.1 10*3/uL (ref 0.0–0.1)
Basophils Relative: 1 % (ref 0–1)
Eosinophils Absolute: 0.1 10*3/uL (ref 0.0–0.7)
Eosinophils Relative: 2 % (ref 0–5)
HCT: 35.8 % — ABNORMAL LOW (ref 39.0–52.0)
Hemoglobin: 12.6 g/dL — ABNORMAL LOW (ref 13.0–17.0)
LYMPHS ABS: 1.8 10*3/uL (ref 0.7–4.0)
LYMPHS PCT: 23 % (ref 12–46)
MCH: 31.3 pg (ref 26.0–34.0)
MCHC: 35.2 g/dL (ref 30.0–36.0)
MCV: 89.1 fL (ref 78.0–100.0)
MONOS PCT: 12 % (ref 3–12)
Monocytes Absolute: 0.9 10*3/uL (ref 0.1–1.0)
NEUTROS ABS: 4.9 10*3/uL (ref 1.7–7.7)
NEUTROS PCT: 62 % (ref 43–77)
Platelets: 195 10*3/uL (ref 150–400)
RBC: 4.02 MIL/uL — AB (ref 4.22–5.81)
RDW: 12.7 % (ref 11.5–15.5)
WBC: 7.8 10*3/uL (ref 4.0–10.5)

## 2014-11-15 LAB — I-STAT CG4 LACTIC ACID, ED: LACTIC ACID, VENOUS: 0.56 mmol/L (ref 0.5–2.0)

## 2014-11-15 LAB — APTT: aPTT: >200 seconds (ref 24–37)

## 2014-11-15 LAB — I-STAT CHEM 8, ED
BUN: 17 mg/dL (ref 6–20)
CHLORIDE: 104 mmol/L (ref 101–111)
Calcium, Ion: 1.53 mmol/L — ABNORMAL HIGH (ref 1.13–1.30)
Creatinine, Ser: 0.9 mg/dL (ref 0.61–1.24)
GLUCOSE: 110 mg/dL — AB (ref 65–99)
HCT: 35 % — ABNORMAL LOW (ref 39.0–52.0)
Hemoglobin: 11.9 g/dL — ABNORMAL LOW (ref 13.0–17.0)
POTASSIUM: 3.9 mmol/L (ref 3.5–5.1)
SODIUM: 137 mmol/L (ref 135–145)
TCO2: 20 mmol/L (ref 0–100)

## 2014-11-15 LAB — I-STAT TROPONIN, ED: Troponin i, poc: 0 ng/mL (ref 0.00–0.08)

## 2014-11-15 MED ORDER — IOHEXOL 350 MG/ML SOLN
100.0000 mL | Freq: Once | INTRAVENOUS | Status: AC | PRN
  Administered 2014-11-15: 100 mL via INTRAVENOUS

## 2014-11-15 MED ORDER — LORAZEPAM 1 MG PO TABS
1.0000 mg | ORAL_TABLET | Freq: Once | ORAL | Status: AC
  Administered 2014-11-15: 1 mg via ORAL
  Filled 2014-11-15: qty 1

## 2014-11-15 MED ORDER — ALPRAZOLAM 0.5 MG PO TABS: 0.5000 mg | ORAL_TABLET | Freq: Every evening | ORAL | Status: AC | PRN

## 2014-11-15 NOTE — Discharge Instructions (Signed)
Shortness of Breath Mr. Ethelene HalWitcher, your CT scan does not show blood clots in your lungs.  See your primary physician within 3 days for close follow up.  Call our oncologist if you wish to establish care here.  If any symptoms worsen, come back to the ED immediately. Thank you. Shortness of breath means you have trouble breathing. Shortness of breath needs medical care right away. HOME CARE   Do not smoke.  Avoid being around chemicals or things (paint fumes, dust) that may bother your breathing.  Rest as needed. Slowly begin your normal activities.  Only take medicines as told by your doctor.  Keep all doctor visits as told. GET HELP RIGHT AWAY IF:   Your shortness of breath gets worse.  You feel lightheaded, pass out (faint), or have a cough that is not helped by medicine.  You cough up blood.  You have pain with breathing.  You have pain in your chest, arms, shoulders, or belly (abdomen).  You have a fever.  You cannot walk up stairs or exercise the way you normally do.  You do not get better in the time expected.  You have a hard time doing normal activities even with rest.  You have problems with your medicines.  You have any new symptoms. MAKE SURE YOU:  Understand these instructions.  Will watch your condition.  Will get help right away if you are not doing well or get worse. Document Released: 11/23/2007 Document Revised: 06/11/2013 Document Reviewed: 08/22/2011 [facility]Exit[facility] Patient Information 2015 [place], [place]. This information is not intended to replace advice given to you by your health care provider. Make sure you discuss any questions you have with your health care provider.

## 2014-11-15 NOTE — ED Notes (Signed)
Pt presents with facial and head pressure onset this afternoon. [facility], neg. [facility]. Decreased appetite, constipation. 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

## 2014-11-15 NOTE — ED Notes (Signed)
PTT >200, kelly RN notified

## 2014-11-15 NOTE — ED Provider Notes (Signed)
CSN: [id number]     Arrival date & time 11/14/14  2255 History   First MD Initiated Contact with Patient 11/14/14 2348     Chief Complaint  Patient presents with  . Shortness of Breath     (Consider location/radiation/quality/duration/timing/severity/associated sxs/prior Treatment) HPI ALIJAH AKRAM is a 64 y.o. male with PMH of RP sarcoma, DVT on xarelto, presenting today with sudden onset SOB.  He states this occurred around 9 PM while sitting down at home. He became suddenly short of breath and it was difficult for him to take a full breath. He denies any chest pain. He's had no fevers or recent infections. Patient has been on Xarelto since January for a DVT. Patient's plan for sarcoma is to have surgery at [facility] on June 12. He states currently he is still having shortness of breath, he has mild chest pain with a deep breath. He has no further complaints.  10 Systems reviewed and are negative for acute change except as noted in the HPI.    Past Medical History  Diagnosis Date  . Glaucoma   . DVT (deep venous thrombosis)    Past Surgical History  Procedure Laterality Date  . Tonsillectomy    . Adenoidectomy    . Pilonidal cyst excision     History reviewed. No pertinent family history. History  Substance Use Topics  . Smoking status: Never Smoker   . Smokeless tobacco: Not on file  . Alcohol Use: No    Review of Systems    Allergies  Penicillins  Home Medications   Prior to Admission medications   Medication Sig Start Date End Date Taking? Authorizing Provider  timolol (BETIMOL) 0.25 % ophthalmic solution 1-2 drops 2 (two) times daily.    Historical Provider, MD  Travoprost, BAK Free, (TRAVATAN) 0.004 % SOLN ophthalmic solution 1 drop at bedtime.    Historical Provider, MD  XARELTO STARTER PACK 15 & 20 MG TBPK Take 15 mg by mouth as directed. Take as directed on package: Start with one  tablet by mouth twice a day with food. On Day 22, switch to one   tablet once a day with food. 06/29/14   Blane Ohara, MD   BP 138/80 mmHg  Pulse 97  Temp(Src) 98.4 F (36.9 C) (Oral)  Resp 20  SpO2 98% Physical Exam  Constitutional: He is oriented to person, place, and time. Vital signs are normal. He appears well-developed.  Non-toxic appearance. He does not appear ill. No distress.  HENT:  Head: Normocephalic and atraumatic.  Nose: Nose normal.  Mouth/Throat: Oropharynx is clear and moist. No oropharyngeal exudate.  Eyes: Conjunctivae and EOM are normal. Pupils are equal, round, and reactive to light. No scleral icterus.  Neck: Normal range of motion. Neck supple. No tracheal deviation, no edema, no erythema and normal range of motion present. No thyroid mass and no thyromegaly present.  Cardiovascular: Normal rate, regular rhythm, S1 normal, S2 normal, normal heart sounds, intact distal pulses and normal pulses.  Exam reveals no gallop and no friction rub.   No murmur heard. Pulses:      Radial pulses are 2+ on the right side, and 2+ on the left side.       Dorsalis pedis pulses are 2+ on the right side, and 2+ on the left side.  Pulmonary/Chest: Effort normal and breath sounds normal. No respiratory distress. He has no wheezes. He has no rhonchi. He has no rales.  Abdominal: Soft. Normal appearance and bowel sounds  are normal. He exhibits no distension, no ascites and no mass. There is no hepatosplenomegaly. There is no tenderness. There is no rebound, no guarding and no CVA tenderness.  Musculoskeletal: Normal range of motion. He exhibits no edema or tenderness.  Lymphadenopathy:    He has no cervical adenopathy.  Neurological: He is alert and oriented to person, place, and time. He has normal strength. No cranial nerve deficit or sensory deficit. He exhibits normal muscle tone.  Skin: Skin is warm, dry and intact. No petechiae and no rash noted. He is not diaphoretic. No erythema. No pallor.  Nursing note and vitals reviewed.   ED Course   Procedures (including critical care time) Labs Review Labs Reviewed  CBC WITH DIFFERENTIAL/PLATELET - Abnormal; Notable for the following:    RBC 4.02 (*)    Hemoglobin 12.6 (*)    HCT 35.8 (*)    All other components within normal limits  PROTIME-INR - Abnormal; Notable for the following:    Prothrombin Time 23.4 (*)    INR 2.10 (*)    All other components within normal limits  APTT - Abnormal; Notable for the following:    aPTT >200 (*)    All other components within normal limits  I-STAT CHEM 8, ED - Abnormal; Notable for the following:    Glucose, Bld 110 (*)    Calcium, Ion 1.53 (*)    Hemoglobin 11.9 (*)    HCT 35.0 (*)    All other components within normal limits  I-STAT CG4 LACTIC ACID, ED  I-STAT TROPOININ, ED    Imaging Review Ct Angio Chest Pe W/cm &/or Wo Cm  11/15/2014   CLINICAL DATA:  Acute onset of shortness of breath. Current history of abdominal sarcoma. Initial encounter.  EXAM: CT ANGIOGRAPHY CHEST WITH CONTRAST  TECHNIQUE: Multidetector CT imaging of the chest was performed using the standard protocol during bolus administration of intravenous contrast. Multiplanar CT image reconstructions and MIPs were obtained to evaluate the vascular anatomy.  CONTRAST:  OMNIPAQUE IOHEXOL 350 MG/ML SOLN  COMPARISON:  CTA of the chest, and chest radiograph, performed 06/29/2014  FINDINGS: There is no evidence of pulmonary embolus.  The lungs appear clear bilaterally. There is no evidence of significant focal consolidation, pleural effusion or pneumothorax. No masses are identified; no abnormal focal contrast enhancement is seen.  The mediastinum is unremarkable in appearance. No mediastinal lymphadenopathy is seen. No pericardial effusion is identified. The great vessels are grossly unremarkable in appearance. 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 No axillary lymphadenopathy is seen. The visualized portions of the thyroid gland are unremarkable  in appearance.  The visualized portions of the liver and spleen are unremarkable. The visualized portions of the pancreas, stomach, adrenal glands and kidneys are within normal limits.  No acute osseous abnormalities are seen.  Review of the MIP images confirms the above findings.  IMPRESSION: 1. No evidence of pulmonary embolus. 2. Lungs clear bilaterally. 3. 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   Electronically Signed   By: Roanna Raider M.D.   On: 11/15/2014 01:59     EKG Interpretation   Date/Time:  Saturday Nov 15 2014 01:08:50 EDT Ventricular Rate:  89 PR Interval:  178 QRS Duration: 97 QT Interval:  355 QTC Calculation: 432 R Axis:   36 Text Interpretation:  Sinus rhythm Abnormal R-wave progression, early  transition No significant change since last tracing Confirmed by Erroll Luna [phone]) on 11/15/2014 1:39:36 AM  MDM   Final diagnoses:  SOB (shortness of breath)   patient since emergency department for sudden onset shortness of breath. In the setting of recently diagnosed sarcoma, patient is at risk for pulmonary embolism. Although patient is compliant with her Xarelto for his DVT, this could be a medication failure. CT angios was ordered for evaluation. Patient's denying any chest pain currently. Laboratory studies and EKG are currently pending.  PTT is greater than 200. This is nonspecific in the setting of Xarelto. The extremes of the number does not necessarily correlate with how much Xarelto is being taken. This only confirms that the patient is in fact taking Xarelto.  EKG does not show any signs of ischemia. History is not consistent with ACS. CT scan is negative for PE or dissection. I'm unsure what is causing this patient's acute shortness of breath. His symptoms seemed to have resolved in the emergency department. He'll be advised to follow-up with a primary care physician within 3 days for close follow-up. His vital signs were  within his normal limits and he is safe for discharge.  Tomasita CrumbleAdeleke Blayklee Mable, MD 11/15/14 [id number]

## 2014-11-15 NOTE — Discharge Instructions (Signed)

## 2014-11-15 NOTE — ED Provider Notes (Addendum)
CSN: [id number]     Arrival date & time 11/15/14  1901 History   First MD Initiated Contact with Patient 11/15/14 1936     Chief Complaint  Patient presents with  . Facial Pain     (Consider location/radiation/quality/duration/timing/severity/associated sxs/prior Treatment) Patient is a 64 y.o. male presenting with headaches. The history is provided by the patient.  Headache Pain location:  Frontal Quality:  Dull Radiates to:  Does not radiate Onset quality:  Gradual Duration:  2 hours Timing:  Constant Progression:  Resolved Chronicity:  Recurrent Similar to prior headaches: yes   Context comment:  At rest Relieved by:  Nothing Worsened by:  Nothing Associated symptoms: no abdominal pain, no cough, no fever and no vomiting     Past Medical History  Diagnosis Date  . Glaucoma   . DVT (deep venous thrombosis)    Past Surgical History  Procedure Laterality Date  . Tonsillectomy    . Adenoidectomy    . Pilonidal cyst excision     No family history on file. History  Substance Use Topics  . Smoking status: Never Smoker   . Smokeless tobacco: Not on file  . Alcohol Use: No    Review of Systems  Constitutional: Negative for fever and chills.  Respiratory: Negative for cough and shortness of breath.   Gastrointestinal: Negative for vomiting and abdominal pain.  Neurological: Positive for headaches.  All other systems reviewed and are negative.     Allergies  Penicillins  Home Medications   Prior to Admission medications   Medication Sig Start Date End Date Taking? Authorizing Provider  omeprazole (PRILOSEC) 40 MG capsule Take 40 mg by mouth daily.   Yes Historical Provider, MD  rivaroxaban (XARELTO) 20 MG TABS tablet Take 20 mg by mouth daily with breakfast.   Yes Historical Provider, MD  timolol (BETIMOL) 0.25 % ophthalmic solution Place 1-2 drops into both eyes 2 (two) times daily.    Yes Historical Provider, MD  traMADol (ULTRAM-ER) 100 MG 24 hr tablet  Take 100 mg by mouth at bedtime.   Yes Historical Provider, MD  Travoprost, BAK Free, (TRAVATAN) 0.004 % SOLN ophthalmic solution Place 1 drop into both eyes at bedtime.    Yes Historical Provider, MD  XARELTO STARTER PACK 15 & 20 MG TBPK Take 15 mg by mouth as directed. Take as directed on package: Start with one 15mg  tablet by mouth twice a day with food. On Day 22, switch to one 20mg  tablet once a day with food. Patient not taking: Reported on 11/15/2014 06/29/14   Blane OharaJoshua Zavitz, MD   BP 123/92 mmHg  Pulse 99  Temp(Src) 98.3 F (36.8 C) (Oral)  Resp 20  Ht 5\' 10"  (1.778 m)  Wt 188 lb (85.276 kg)  BMI 26.98 kg/m2  SpO2 99% Physical Exam  Constitutional: He is oriented to person, place, and time. He appears well-developed and well-nourished. No distress.  HENT:  Head: Normocephalic and atraumatic.  Mouth/Throat: Oropharynx is clear and moist. No oropharyngeal exudate.  Eyes: EOM are normal. Pupils are equal, round, and reactive to light.  Neck: Normal range of motion. Neck supple.  Cardiovascular: Normal rate and regular rhythm.  Exam reveals no friction rub.   No murmur heard. Pulmonary/Chest: Effort normal and breath sounds normal. No respiratory distress. He has no wheezes. He has no rales.  Abdominal: Soft. He exhibits no distension. There is no tenderness. There is no rebound.  Musculoskeletal: Normal range of motion. He exhibits no edema.  Neurological:  He is alert and oriented to person, place, and time. No cranial nerve deficit. He exhibits normal muscle tone. Coordination normal.  Skin: No rash noted. He is not diaphoretic.  Nursing note and vitals reviewed.   ED Course  Procedures (including critical care time) Labs Review Labs Reviewed - No data to display  Imaging Review Ct Angio Chest Pe W/cm &/or Wo Cm  11/15/2014   CLINICAL DATA:  Acute onset of shortness of breath. Current history of abdominal sarcoma. Initial encounter.  EXAM: CT ANGIOGRAPHY CHEST WITH CONTRAST   TECHNIQUE: Multidetector CT imaging of the chest was performed using the standard protocol during bolus administration of intravenous contrast. Multiplanar CT image reconstructions and MIPs were obtained to evaluate the vascular anatomy.  CONTRAST:  OMNIPAQUE IOHEXOL 350 MG/ML SOLN  COMPARISON:  CTA of the chest, and chest radiograph, performed 06/29/2014  FINDINGS: There is no evidence of pulmonary embolus.  The lungs appear clear bilaterally. There is no evidence of significant focal consolidation, pleural effusion or pneumothorax. No masses are identified; no abnormal focal contrast enhancement is seen.  The mediastinum is unremarkable in appearance. No mediastinal lymphadenopathy is seen. No pericardial effusion is identified. The great vessels are grossly unremarkable in appearance. 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 No axillary lymphadenopathy is seen. The visualized portions of the thyroid gland are unremarkable in appearance.  The visualized portions of the liver and spleen are unremarkable. The visualized portions of the pancreas, stomach, adrenal glands and kidneys are within normal limits.  No acute osseous abnormalities are seen.  Review of the MIP images confirms the above findings.  IMPRESSION: 1. No evidence of pulmonary embolus. 2. Lungs clear bilaterally. 3. 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   Electronically Signed   By: Roanna Raider M.D.   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 He's had this headache a few times in the past 3 weeks. She was the worst. Gradual onset, no thunderclap. Began around 5 PM. It subsided after about an hour. He denied any fever, nausea, vomiting, blurred vision, numbness or tingling. He denies any trauma. There were no alleviating or exacerbating factors. He  does state he has been very stressed with his recent cancer diagnosis and upcoming surgery for removal.  He was seen last night for shortness of breath. He had a negative PET scan of the chest and an unremarkable workup otherwise. Here vitals are stable. Headache is gone. Neurologic exam is normal. With his recent cancer diagnosis and since he is on Xarelto for his previous DVT, we'll scan his head. Patient did seem anxious or offered 1 mg of Ativan which he accepted.  CT Head ok. Stable for discharge. He did feel better after the Ativan. Given 10 Xanax for anxiety. I explained risks of interpreting CP or SOB as anxiety and not to take it for this. He and his wife understand.   Elwin Mocha, MD 11/15/14 2117  Elwin Mocha, MD 11/15/14 2130

## 2016-02-16 IMAGING — CT CT ANGIO CHEST
2 of 6 series · 19 of 36 positions shown · IV contrast (omnipaque)
Comparison: None.

CLINICAL DATA: Lower extremity swelling and pain. Recent air travel
on [REDACTED].

EXAM:
CT ANGIOGRAPHY CHEST WITH CONTRAST
TECHNIQUE: Multidetector CT imaging of the chest was performed using the
standard protocol during bolus administration of intravenous
contrast. Multiplanar CT image reconstructions and MIPs were
obtained to evaluate the vascular anatomy.
CONTRAST:  100mL OMNIPAQUE IOHEXOL 350 MG/ML SOLN

[Series 5: pe 1.0 b26f · axial · 0.73mm/px · z∈[+1064,+1374]mm · 18 of 346 slices shown]
[im 18/346  lung]
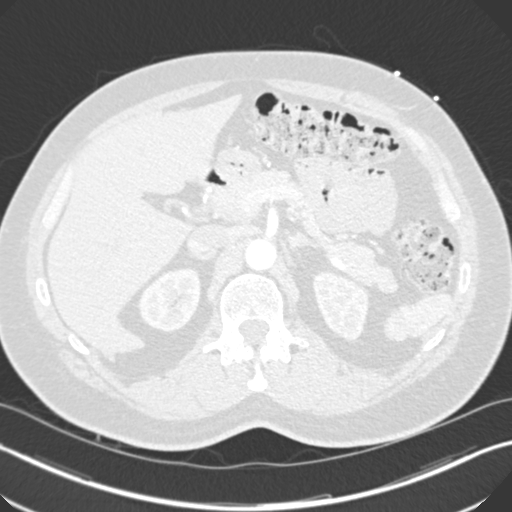
[im 35/346  mediastinal]
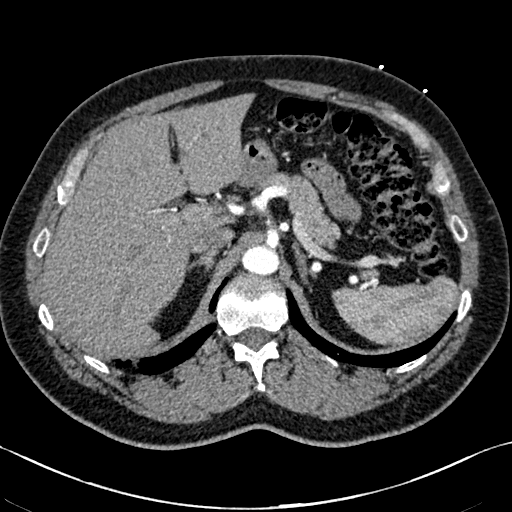
[im 52/346  lung]
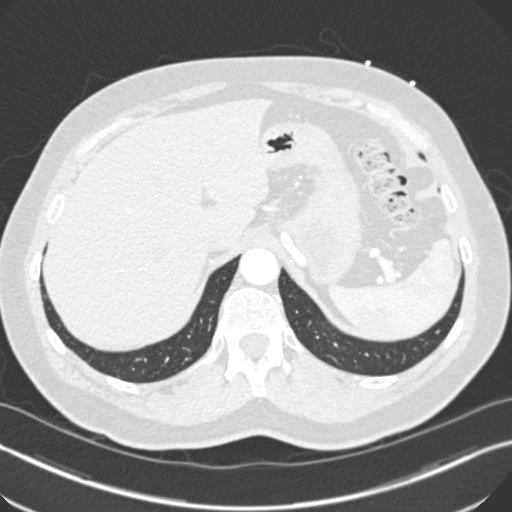
[im 70/346  mediastinal]
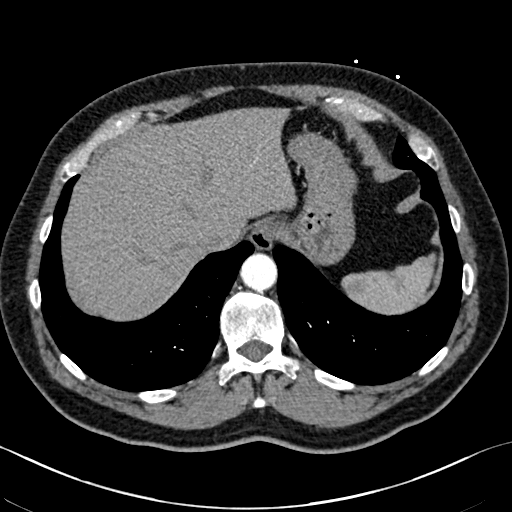
[im 87/346  lung]
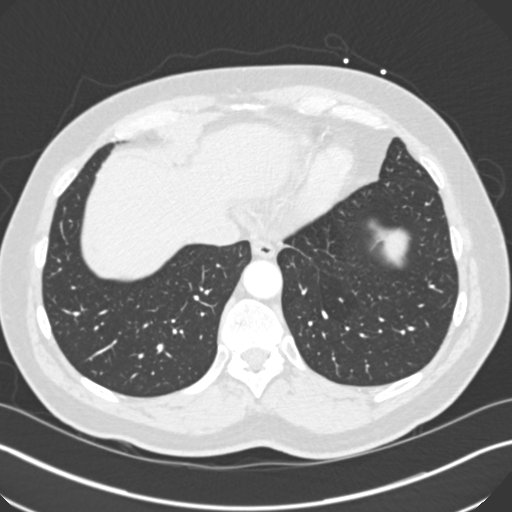
[im 104/346  mediastinal]
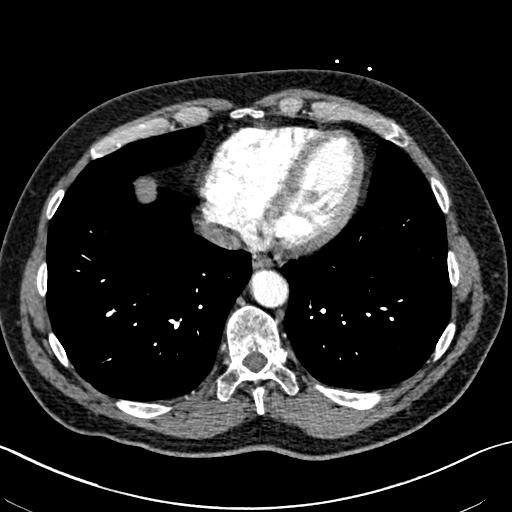
[im 121/346  lung]
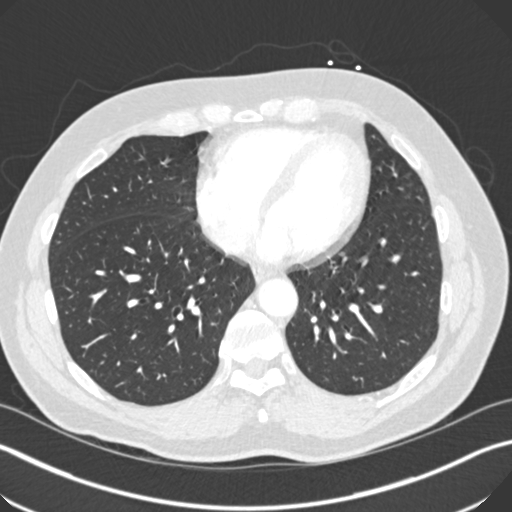
[im 139/346  mediastinal]
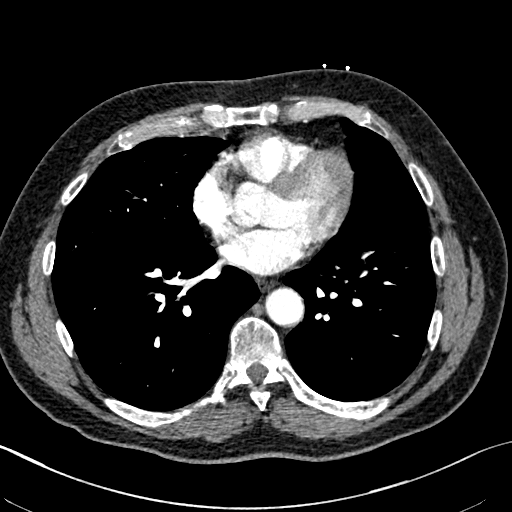
[im 156/346  lung]
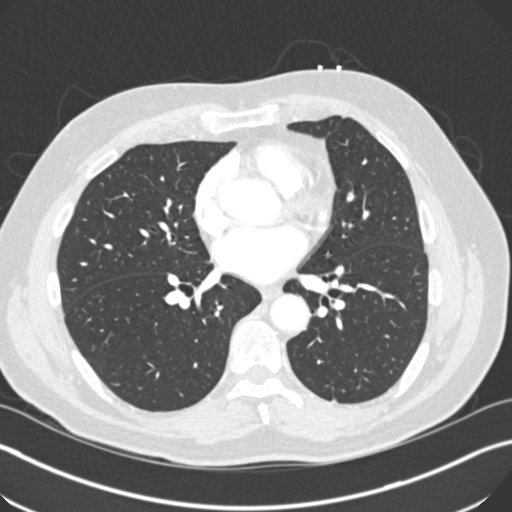
[im 190/346  mediastinal]
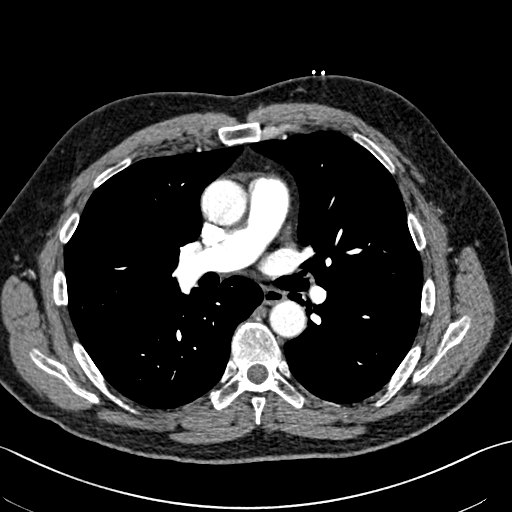
[im 208/346  lung]
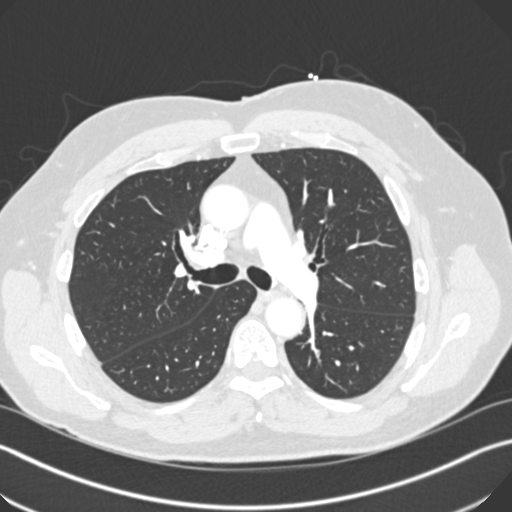
[im 225/346  mediastinal]
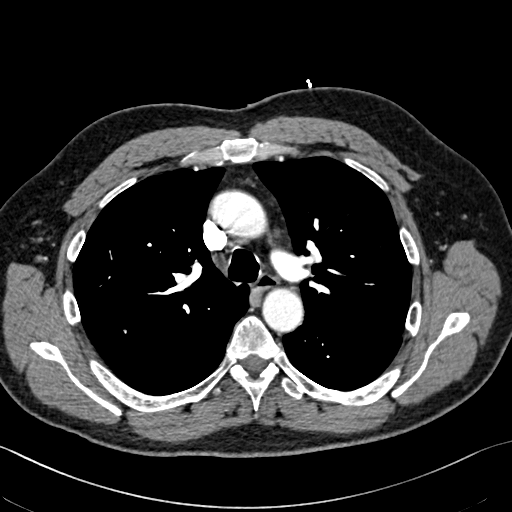
[im 242/346  lung]
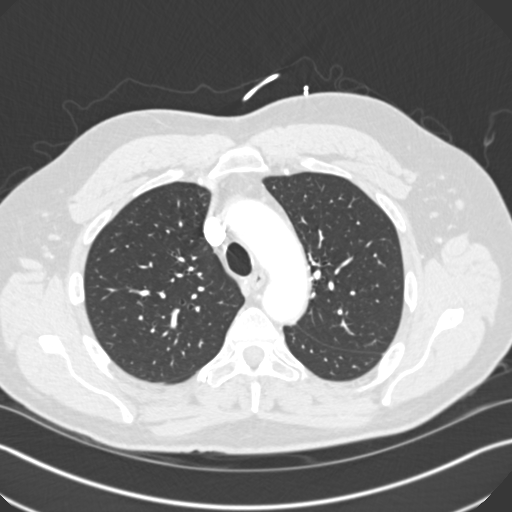
[im 259/346  mediastinal]
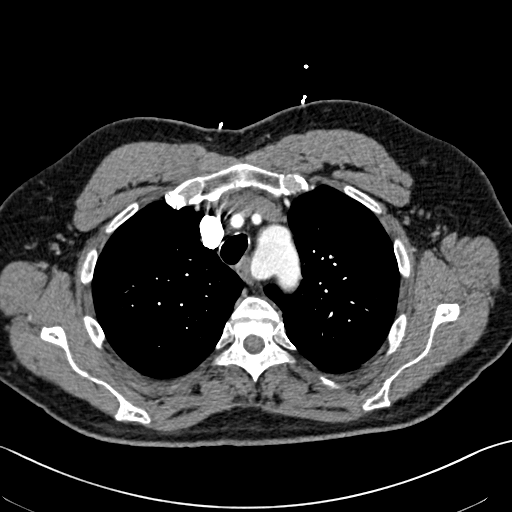
[im 277/346  lung]
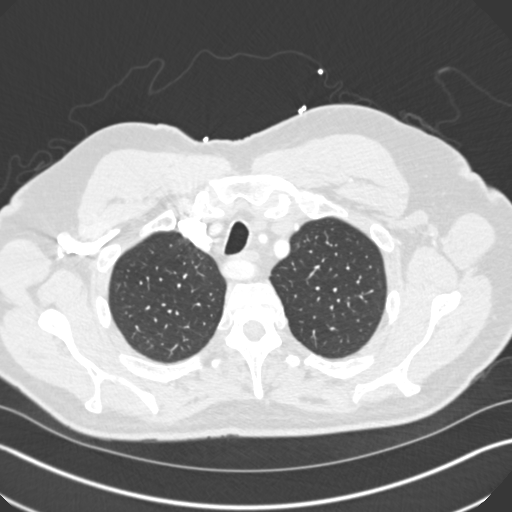
[im 294/346  mediastinal]
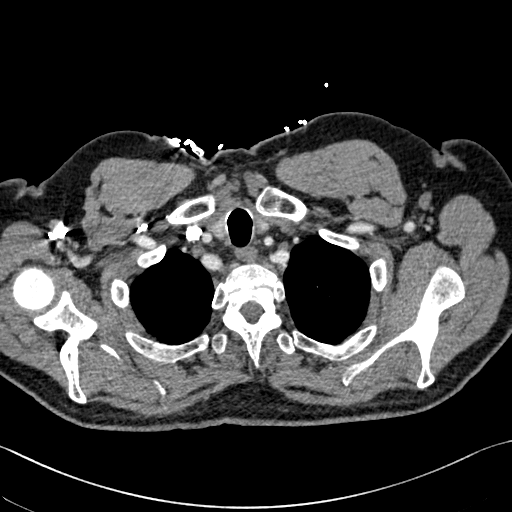
[im 311/346  lung]
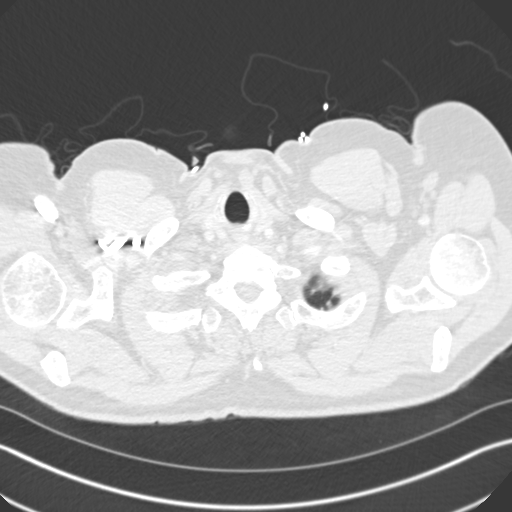
[im 328/346  mediastinal]
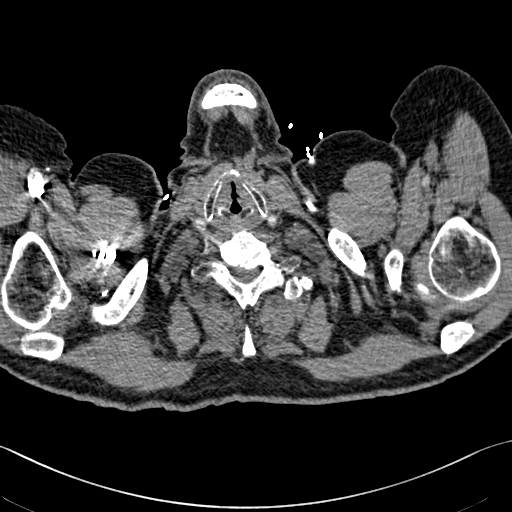

[Series 8: pe 2.0 coronal · coronal · 0.70mm/px · 1 of 133 slices shown]
[im 67/133  mediastinal]
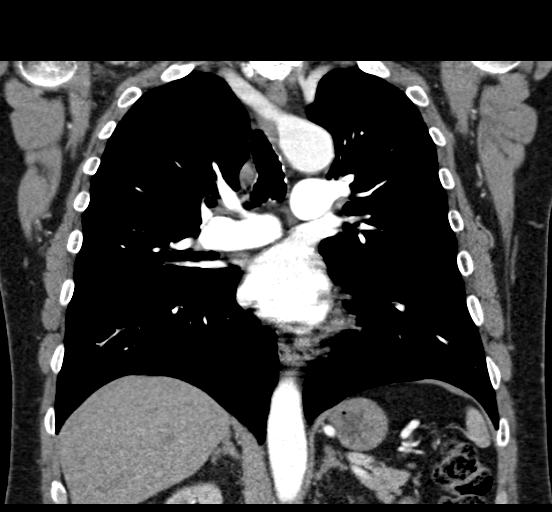

[19 of 36 positions shown; findings below may reference images not displayed]

FINDINGS: Technically adequate study with good opacification of the central
and segmental pulmonary arteries. No focal filling defects. No
evidence of significant pulmonary embolus.

Normal heart size. Normal caliber thoracic aorta. No evidence of
aortic dissection. Great vessel origins are patent. Variant anatomy
with aberrant right subclavian artery. The esophagus is
decompressed. No significant lymphadenopathy in the chest. All

Lungs are clear. No focal airspace disease or consolidation. Airways
appear patent. No pleural effusion. No pneumothorax.

Included portions of the upper abdominal organs are grossly
unremarkable. Mild degenerative changes in the thoracic spine with
normal alignment. No destructive bone lesions appreciated.

Review of the MIP images confirms the above findings.
IMPRESSION: No evidence of significant pulmonary embolus. No evidence of active
pulmonary disease. Incidental note of aberrant right subclavian
artery.

## 2016-02-16 IMAGING — CR DG CHEST 1V PORT
1 series · 1 of 1 positions shown · non-contrast
Comparison: None.

CLINICAL DATA: Acute onset LEFT leg swelling. Recent flight on
[REDACTED].

EXAM:
PORTABLE CHEST - 1 VIEW

[view not recorded]
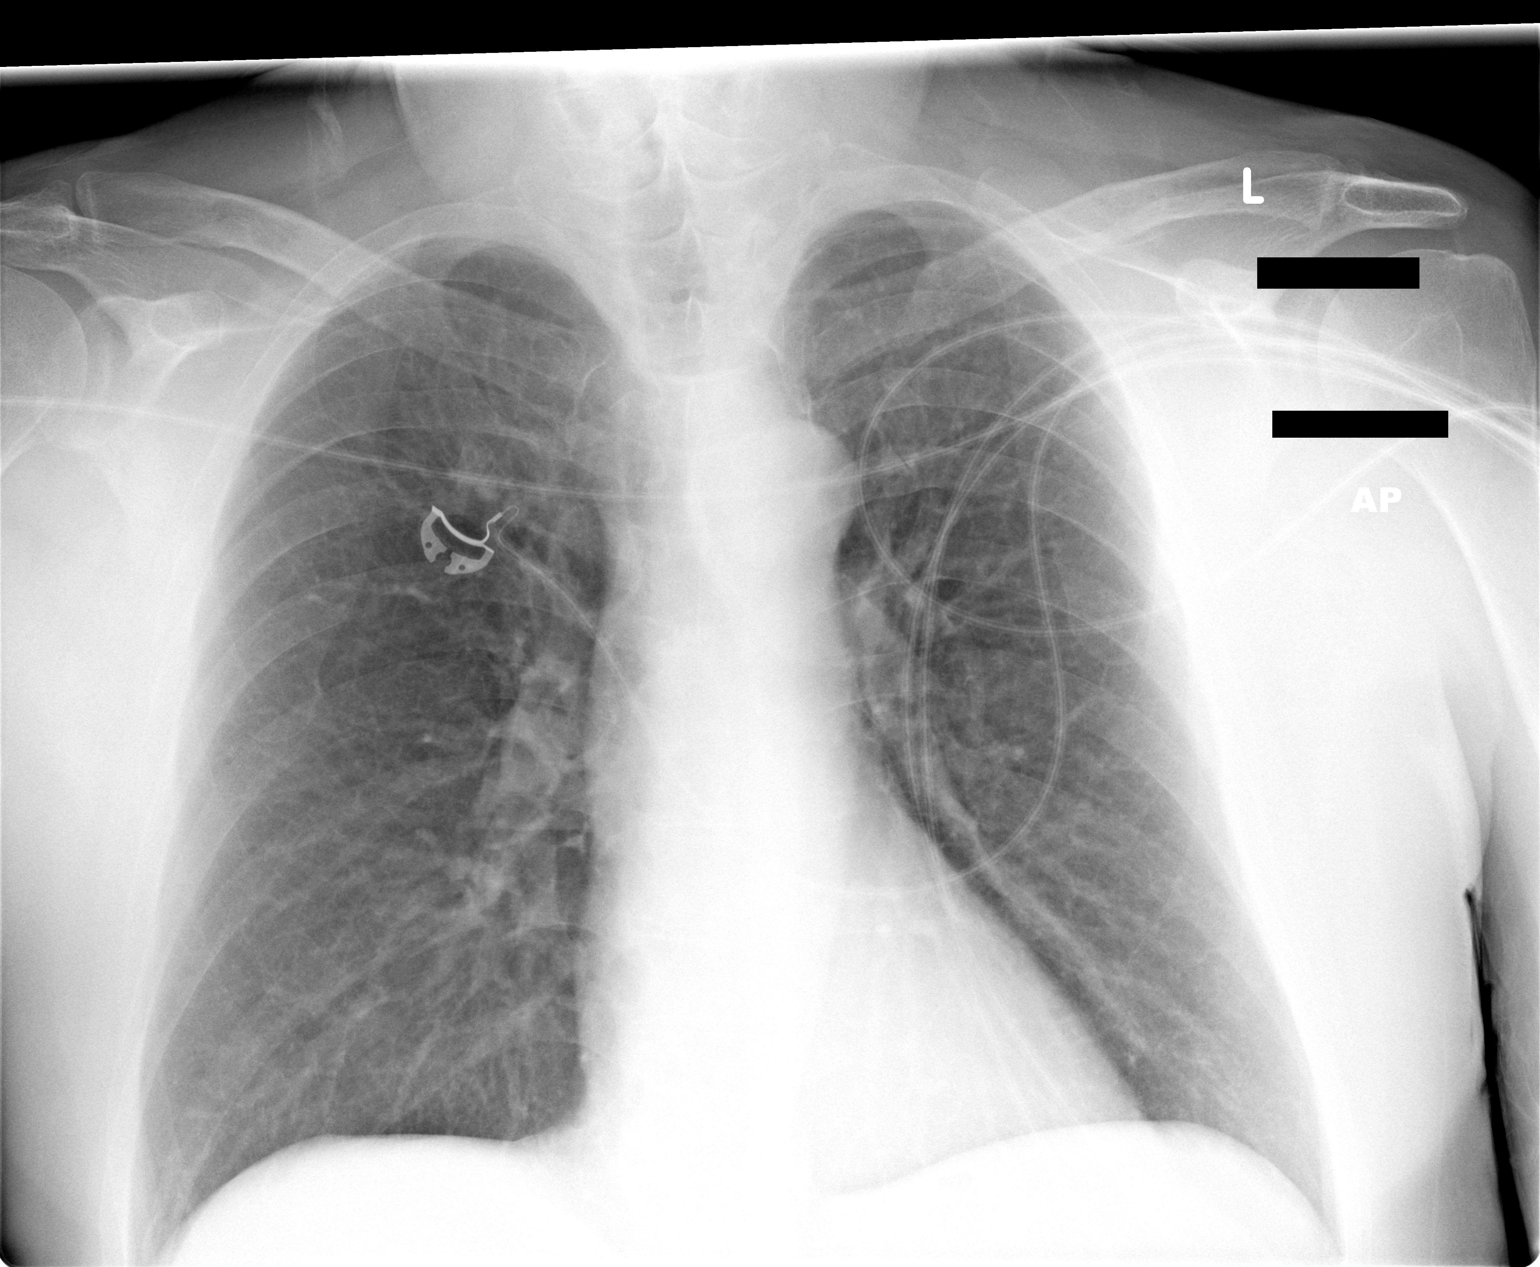

[1 of 1 positions shown; findings below may reference images not displayed]

FINDINGS: Cardiomediastinal silhouette is unremarkable. Mild pulmonary hyper
expansion. The lungs are clear without pleural effusions or focal
consolidations. Trachea projects midline and there is no
pneumothorax. Soft tissue planes and included osseous structures are
non-suspicious.
IMPRESSION: No acute cardiopulmonary process.

  By: Emetullah Esgi
